# Patient Record
Sex: Male | Born: 1985 | Race: White | Hispanic: No | Marital: Single | Smoking: Never smoker
Health system: Southern US, Community
[De-identification: ages and names within clinical notes are randomized; demographics above are authoritative.]

---

## 2014-01-10 ENCOUNTER — Emergency Department (HOSPITAL_COMMUNITY)
Admission: EM | Admit: 2014-01-10 | Discharge: 2014-01-11 | Disposition: A | Discharge status: Home / Self Care | Payer: 59 | Attending: Emergency Medicine | Admitting: Emergency Medicine

## 2014-01-10 ENCOUNTER — Encounter (HOSPITAL_COMMUNITY): Payer: Self-pay | Admitting: Emergency Medicine

## 2014-01-10 ENCOUNTER — Emergency Department (HOSPITAL_COMMUNITY): Payer: 59

## 2014-01-10 DIAGNOSIS — R630 Anorexia: Secondary | ICD-10-CM | POA: Insufficient documentation

## 2014-01-10 DIAGNOSIS — E291 Testicular hypofunction: Secondary | ICD-10-CM | POA: Insufficient documentation

## 2014-01-10 LAB — MONONUCLEOSIS SCREEN: MONO SCREEN: NEGATIVE

## 2014-01-10 LAB — CBC
HCT: 47.6 % (ref 39.0–52.0)
Hemoglobin: 16.3 g/dL (ref 13.0–17.0)
MCH: 30.8 pg (ref 26.0–34.0)
MCHC: 34.2 g/dL (ref 30.0–36.0)
MCV: 89.8 fL (ref 78.0–100.0)
PLATELETS: 241 10*3/uL (ref 150–400)
RBC: 5.3 MIL/uL (ref 4.22–5.81)
RDW: 13.3 % (ref 11.5–15.5)
WBC: 6.4 10*3/uL (ref 4.0–10.5)

## 2014-01-10 LAB — BASIC METABOLIC PANEL
BUN: 11 mg/dL (ref 6–23)
CALCIUM: 10 mg/dL (ref 8.4–10.5)
CO2: 28 meq/L (ref 19–32)
CREATININE: 0.92 mg/dL (ref 0.50–1.35)
Chloride: 102 mEq/L (ref 96–112)
GFR calc non Af Amer: >90 mL/min (ref >90)
Glucose, Bld: 99 mg/dL (ref 70–99)
Potassium: 4.5 mEq/L (ref 3.7–5.3)
Sodium: 143 mEq/L (ref 137–147)

## 2014-01-10 LAB — URINALYSIS, ROUTINE W REFLEX MICROSCOPIC
Bilirubin Urine: NEGATIVE
GLUCOSE, UA: NEGATIVE mg/dL
Hgb urine dipstick: NEGATIVE
KETONES UR: 15 mg/dL — AB
Leukocytes, UA: NEGATIVE
NITRITE: NEGATIVE
PROTEIN: NEGATIVE mg/dL
Specific Gravity, Urine: 1.024 (ref 1.005–1.030)
UROBILINOGEN UA: 1 mg/dL (ref 0.0–1.0)
pH: 7.5 (ref 5.0–8.0)

## 2014-01-10 LAB — CBG MONITORING, ED: Glucose-Capillary: 90 mg/dL (ref 70–99)

## 2014-01-10 NOTE — ED Notes (Signed)
Patient presents today with a chief complaint of generalized weakness and fatigue intermittently for one year with worsening weakness over past 2 weeks. Patient has seen PCP who has referred him to endocrinology for evaluation of pituitary glad function, patient reports endocrinology cannot see patient for 6 months.

## 2014-01-10 NOTE — ED Provider Notes (Signed)
CSN: [id number]     Arrival date & time 01/10/14  1705 History   First MD Initiated Contact with Patient 01/10/14 2135     Chief Complaint  Patient presents with  . Fatigue     (Consider location/radiation/quality/duration/timing/severity/associated sxs/prior Treatment) HPI  Patient presents to the emergency department with his mother for evaluation of his generalized weakness and fatigue that he's been having for the past year. He saw his primary care doctor who thoroughly worked him up with a whole gamut of blood tests. The tests showed that the patient's testosterone is very low. He has been referred to an endocrinologist but the mom is concerned that no one will be able to see him sooner than 6 months. She reports that she is losing muscle mass, fatigued, and he has decreased libido. They feel that it is rapidly getting worse and does not want to wait, does not feel that she can wait 6 more months to be seen. He has lost 30 lbs. The PCP wants his pituitary gland assessed. NO new symptoms or complaints. He came today in hopes that we can help him see an endocrinologist faster than 6 months.  History reviewed. No pertinent past medical history. History reviewed. No pertinent past surgical history. No family history on file. History  Substance Use Topics  . Smoking status: Never Smoker   . Smokeless tobacco: Not on file  . Alcohol Use: No    Review of Systems  Constitutional: Positive for activity change, fatigue and unexpected weight change. Negative for appetite change.       Allergies  Review of patient's allergies indicates no known allergies.  Home Medications   Prior to Admission medications   Not on File   BP 137/68  Pulse 61  Temp(Src) 97.9 F (36.6 C) (Oral)  Resp 15  SpO2 98% Physical Exam  Nursing note and vitals reviewed. Constitutional: He is oriented to person, place, and time. He appears well-developed and well-nourished. No distress.  Pt appears to be  skinnier than expected for his age and frame.  HENT:  Head: Normocephalic and atraumatic.  Eyes: Pupils are equal, round, and reactive to light.  Neck: Normal range of motion. Neck supple.  Cardiovascular: Normal rate and regular rhythm.   Pulmonary/Chest: Effort normal.  Abdominal: Soft.  Neurological: He is alert and oriented to person, place, and time. He has normal strength. No cranial nerve deficit or sensory deficit. He displays a negative Romberg sign. GCS eye subscore is 4. GCS verbal subscore is 5. GCS motor subscore is 6.  Skin: Skin is warm and dry.    ED Course  Procedures (including critical care time) Labs Review Labs Reviewed  URINALYSIS, ROUTINE W REFLEX MICROSCOPIC - Abnormal; Notable for the following:    APPearance CLOUDY (*)    Ketones, ur 15 (*)    All other components within normal limits  CBC  BASIC METABOLIC PANEL  MONONUCLEOSIS SCREEN  CBG MONITORING, ED    Imaging Review Dg Chest 2 View  01/10/2014   CLINICAL DATA:  Fatigue.  EXAM: CHEST  2 VIEW  COMPARISON:  None.  FINDINGS: The heart size and mediastinal contours are within normal limits. Both lungs are clear. The visualized skeletal structures are unremarkable.  IMPRESSION: No active cardiopulmonary disease.   Electronically Signed   By: Andreas NewportGeoffrey  Lamke M.D.   On: 01/10/2014 22:24     EKG Interpretation None      MDM   Final diagnoses:  Hypogonadism male  Patient showed me the laboratory testing from the PCP. Testosterone is low. It is currently midnight and we have no endocrinologist at home. This is a chronic problem and does not require admittance. Work-up done here in the ED has resulted no abnormal findings.  I will call the offices tomorrow and see if I can help him be seen sooner.   28 y.o.Feliz Beamravis Mamone's evaluation in the Emergency Department is complete. It has been determined that no acute conditions requiring further emergency intervention are present at this time. The  patient/guardian have been advised of the diagnosis and plan. We have discussed signs and symptoms that warrant return to the ED, such as changes or worsening in symptoms.  Vital signs are stable at discharge. Filed Vitals:   01/10/14 2330  BP: 137/68  Pulse: 61  Temp:   Resp: 15    Patient/guardian has voiced understanding and agreed to follow-up with the PCP or specialist.     Dorthula Matasiffany G Greene, PA-C 01/11/14 0031

## 2014-01-10 NOTE — ED Notes (Signed)
Pt transported to Xray. 

## 2014-01-11 NOTE — ED Provider Notes (Signed)
Medical screening examination/treatment/procedure(s) were performed by non-physician practitioner and as supervising physician I was immediately available for consultation/collaboration.   EKG Interpretation None       Briannah Lona, MD 01/11/14 0805 

## 2014-01-11 NOTE — ED Notes (Signed)
PA gave pt paperwork.  Unable to get signature for discharge due to pt no longer being in room.

## 2015-07-12 IMAGING — CR DG CHEST 2V
2 series · 2 of 2 positions shown · non-contrast
Comparison: None.

CLINICAL DATA: Fatigue.

EXAM:
CHEST  2 VIEW

[w chest pa]
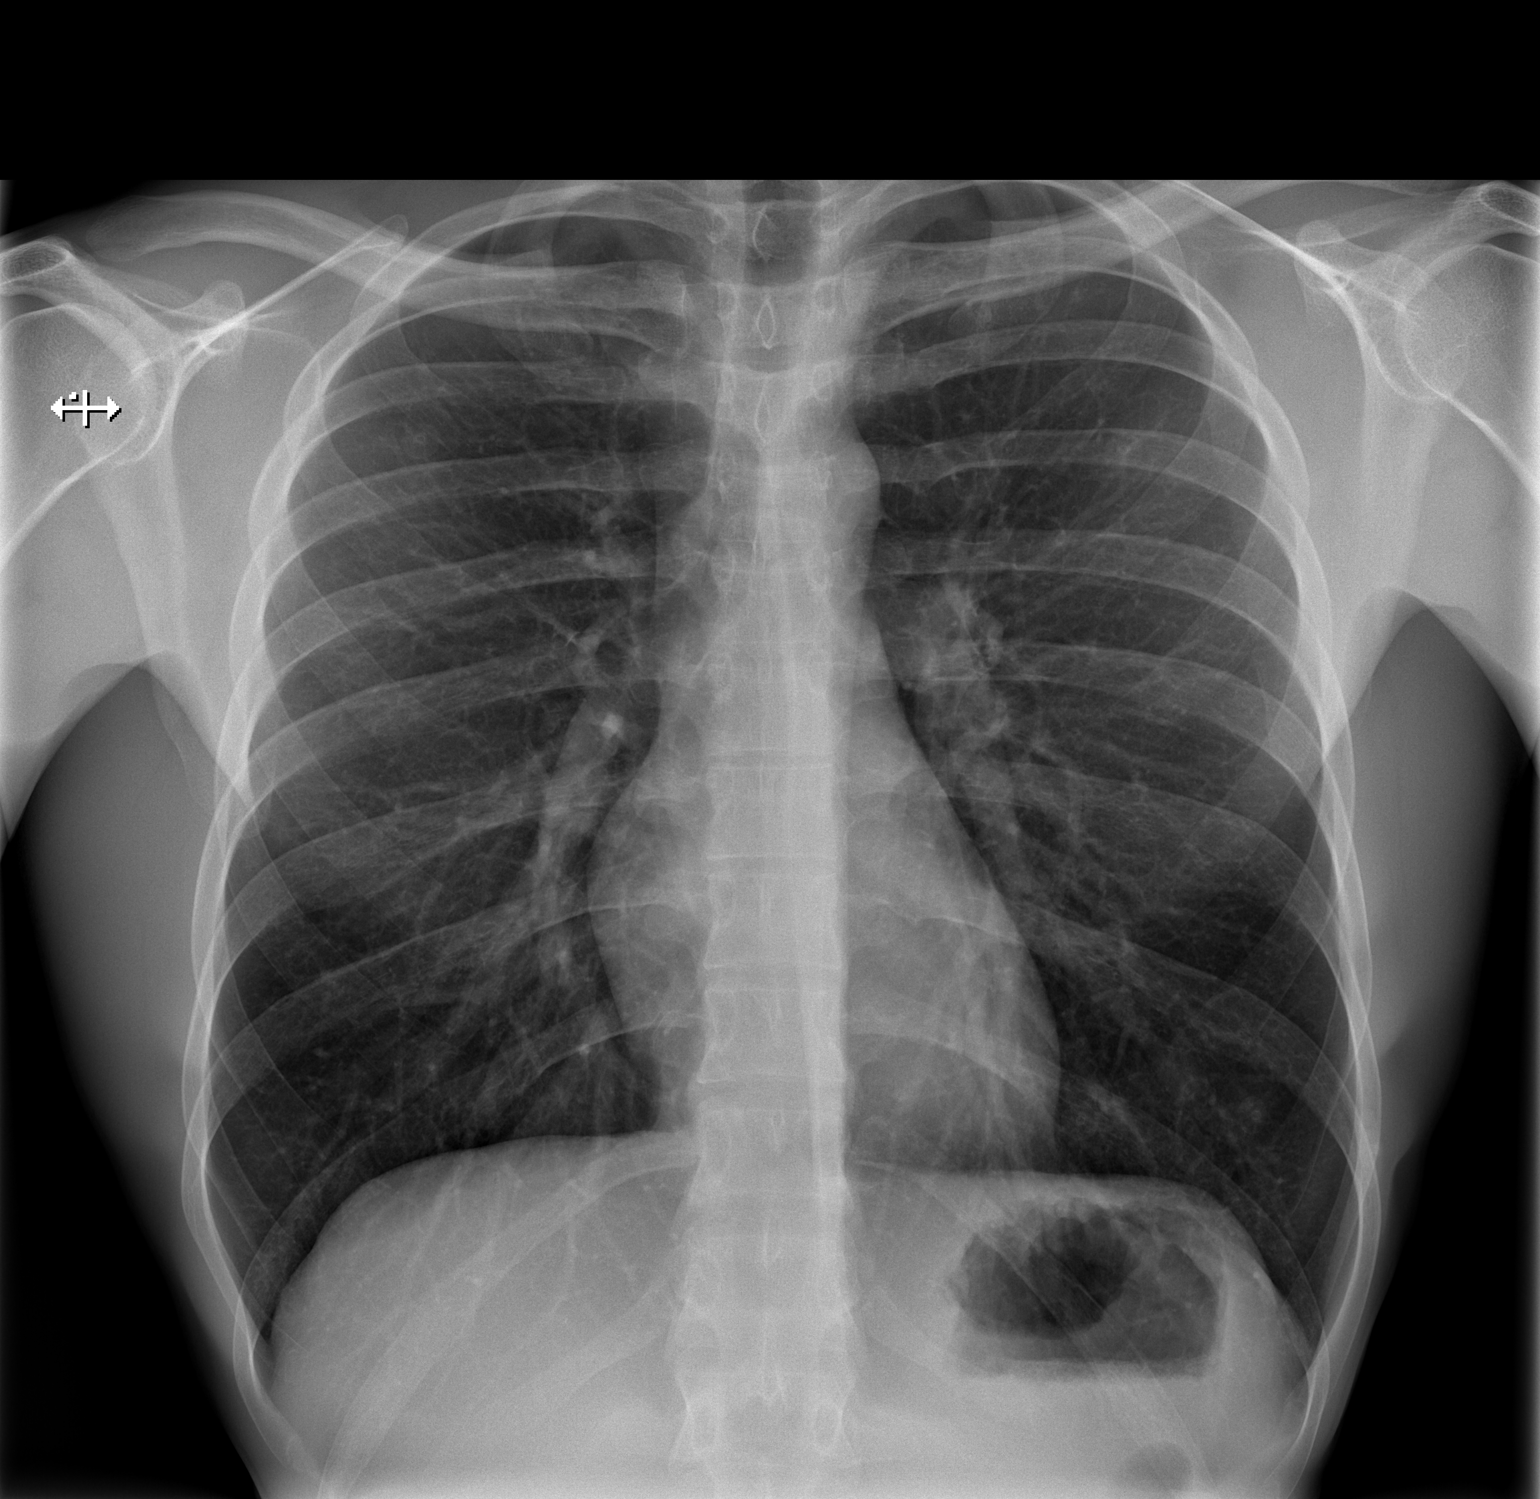

[w chest lat]
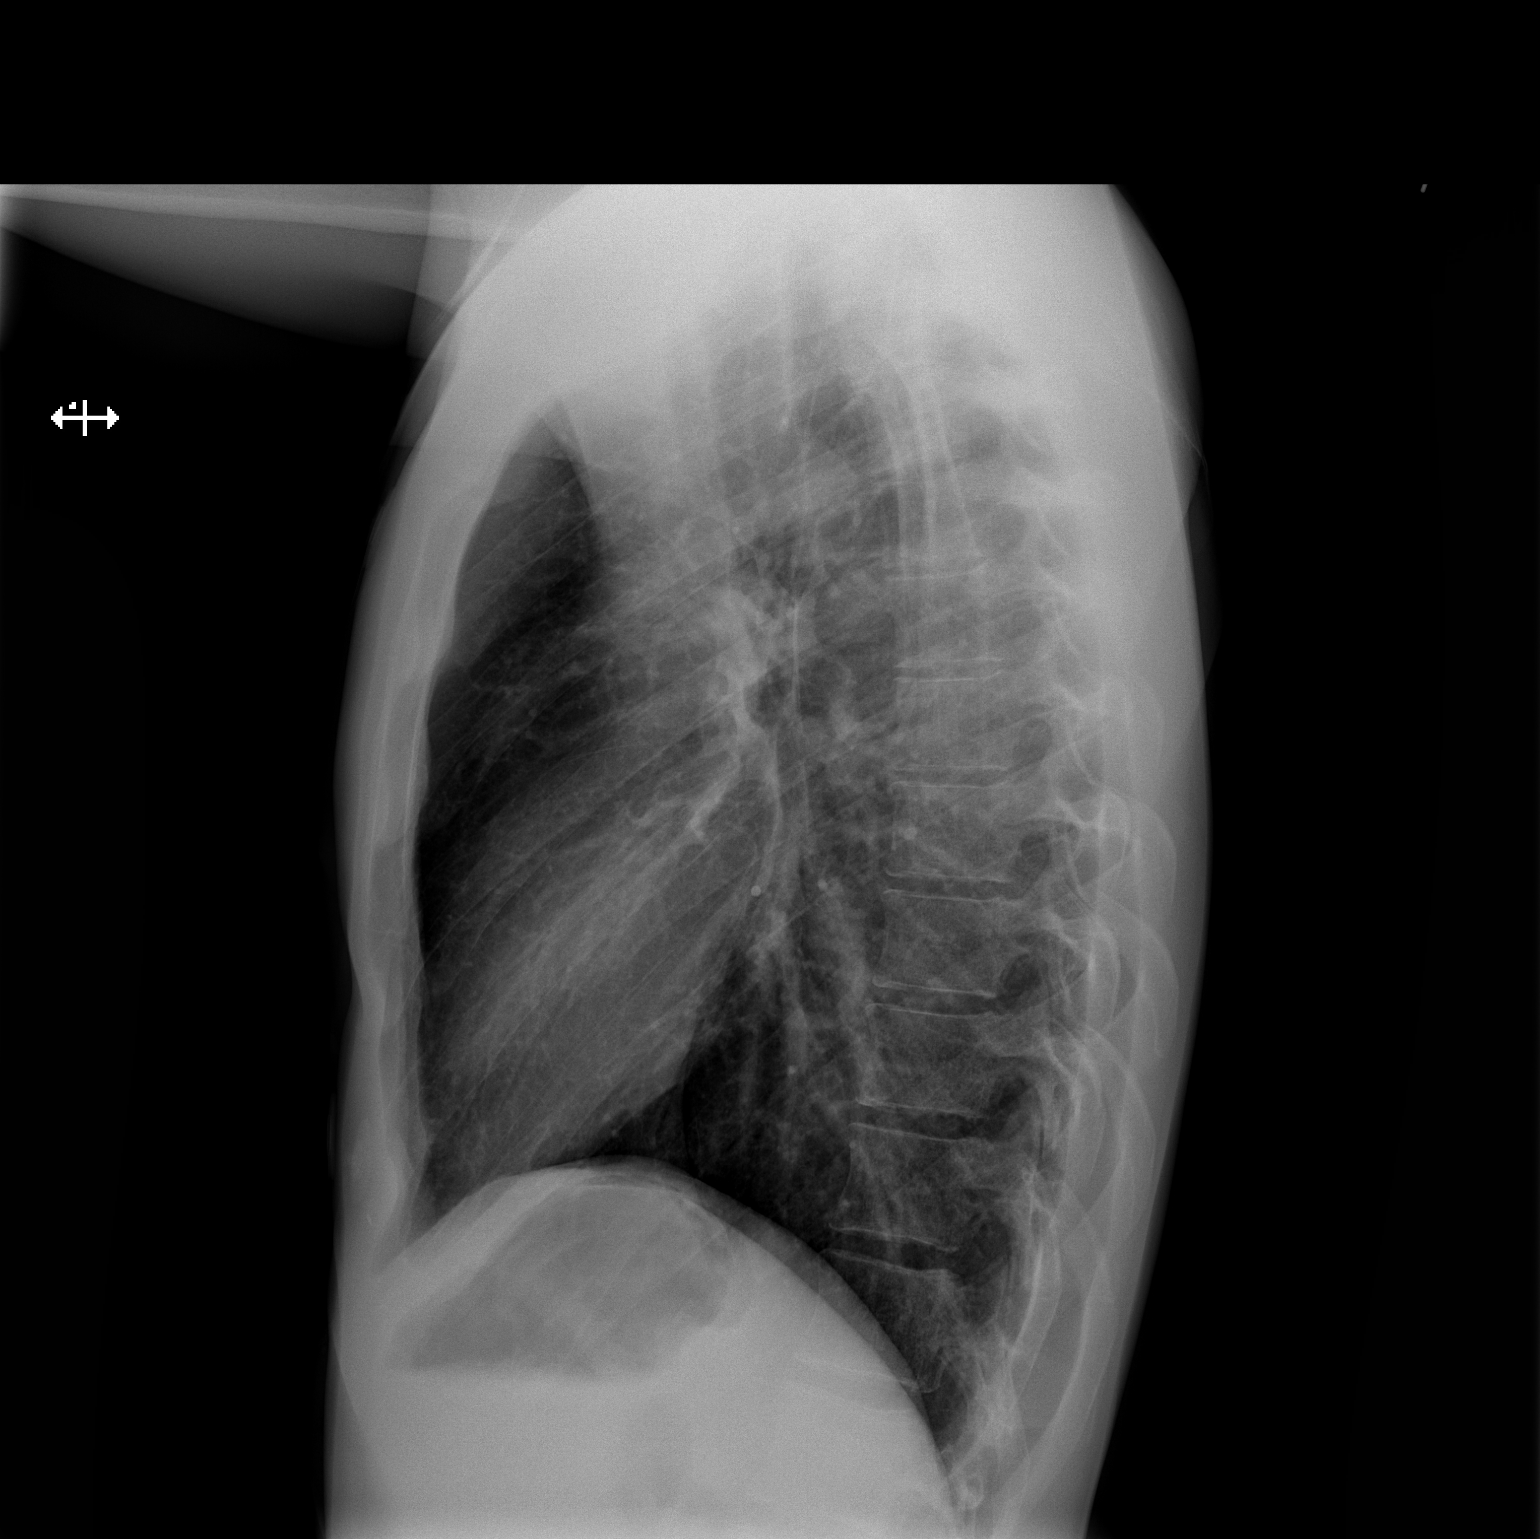

[2 of 2 positions shown; findings below may reference images not displayed]

FINDINGS: The heart size and mediastinal contours are within normal limits.
Both lungs are clear. The visualized skeletal structures are
unremarkable.
IMPRESSION: No active cardiopulmonary disease.

## 2019-03-01 ENCOUNTER — Ambulatory Visit: Payer: Self-pay | Admitting: Allergy and Immunology
